# Patient Record
Sex: Female | Born: 1960 | Race: Black or African American | Hispanic: No | Smoking: Never smoker
Health system: Southern US, Community
[De-identification: ages and names within clinical notes are randomized; demographics above are authoritative.]

## PROBLEM LIST (undated history)

## (undated) DIAGNOSIS — Z8669 Personal history of other diseases of the nervous system and sense organs: Secondary | ICD-10-CM

## (undated) DIAGNOSIS — Z8742 Personal history of other diseases of the female genital tract: Secondary | ICD-10-CM

## (undated) DIAGNOSIS — L219 Seborrheic dermatitis, unspecified: Secondary | ICD-10-CM

## (undated) DIAGNOSIS — D219 Benign neoplasm of connective and other soft tissue, unspecified: Secondary | ICD-10-CM

## (undated) DIAGNOSIS — L659 Nonscarring hair loss, unspecified: Secondary | ICD-10-CM

## (undated) DIAGNOSIS — D649 Anemia, unspecified: Secondary | ICD-10-CM

## (undated) DIAGNOSIS — I493 Ventricular premature depolarization: Secondary | ICD-10-CM

## (undated) DIAGNOSIS — A6000 Herpesviral infection of urogenital system, unspecified: Secondary | ICD-10-CM

## (undated) DIAGNOSIS — K219 Gastro-esophageal reflux disease without esophagitis: Secondary | ICD-10-CM

## (undated) DIAGNOSIS — D329 Benign neoplasm of meninges, unspecified: Secondary | ICD-10-CM

## (undated) DIAGNOSIS — J329 Chronic sinusitis, unspecified: Secondary | ICD-10-CM

## (undated) HISTORY — DX: Anemia, unspecified: D64.9

## (undated) HISTORY — DX: Personal history of other diseases of the nervous system and sense organs: Z86.69

## (undated) HISTORY — DX: Gastro-esophageal reflux disease without esophagitis: K21.9

## (undated) HISTORY — DX: Herpesviral infection of urogenital system, unspecified: A60.00

## (undated) HISTORY — PX: TUBAL LIGATION: SHX77

## (undated) HISTORY — DX: Benign neoplasm of meninges, unspecified: D32.9

## (undated) HISTORY — DX: Nonscarring hair loss, unspecified: L65.9

## (undated) HISTORY — DX: Benign neoplasm of connective and other soft tissue, unspecified: D21.9

## (undated) HISTORY — DX: Seborrheic dermatitis, unspecified: L21.9

## (undated) HISTORY — DX: Chronic sinusitis, unspecified: J32.9

## (undated) HISTORY — DX: Personal history of other diseases of the female genital tract: Z87.42

## (undated) HISTORY — DX: Ventricular premature depolarization: I49.3

## (undated) HISTORY — PX: DILATION AND CURETTAGE OF UTERUS: SHX78

## (undated) HISTORY — PX: APPENDECTOMY: SHX54

---

## 1996-12-20 HISTORY — PX: LAPAROSCOPY: SHX197

## 1998-08-11 ENCOUNTER — Encounter: Admission: RE | Admit: 1998-08-11 | Discharge: 1998-08-11 | Payer: Self-pay | Admitting: *Deleted

## 1999-09-09 ENCOUNTER — Encounter: Payer: Self-pay | Admitting: *Deleted

## 1999-09-09 ENCOUNTER — Ambulatory Visit (HOSPITAL_COMMUNITY): Admission: RE | Admit: 1999-09-09 | Discharge: 1999-09-09 | Payer: Self-pay | Admitting: *Deleted

## 2002-04-03 ENCOUNTER — Encounter: Payer: Self-pay | Admitting: Family Medicine

## 2002-04-03 ENCOUNTER — Ambulatory Visit (HOSPITAL_COMMUNITY): Admission: RE | Admit: 2002-04-03 | Discharge: 2002-04-03 | Payer: Self-pay | Admitting: Family Medicine

## 2002-09-13 ENCOUNTER — Ambulatory Visit (HOSPITAL_COMMUNITY): Admission: RE | Admit: 2002-09-13 | Discharge: 2002-09-13 | Payer: Self-pay | Admitting: Gastroenterology

## 2002-10-05 ENCOUNTER — Encounter: Admission: RE | Admit: 2002-10-05 | Discharge: 2002-10-05 | Payer: Self-pay | Admitting: Gastroenterology

## 2002-10-05 ENCOUNTER — Encounter: Payer: Self-pay | Admitting: Gastroenterology

## 2004-01-29 ENCOUNTER — Emergency Department (HOSPITAL_COMMUNITY): Admission: EM | Admit: 2004-01-29 | Discharge: 2004-01-29 | Payer: Self-pay | Admitting: Emergency Medicine

## 2004-04-07 ENCOUNTER — Other Ambulatory Visit: Admission: RE | Admit: 2004-04-07 | Discharge: 2004-04-07 | Payer: Self-pay | Admitting: Family Medicine

## 2005-05-04 ENCOUNTER — Other Ambulatory Visit: Admission: RE | Admit: 2005-05-04 | Discharge: 2005-05-04 | Payer: Self-pay | Admitting: Family Medicine

## 2005-08-06 ENCOUNTER — Encounter (INDEPENDENT_AMBULATORY_CARE_PROVIDER_SITE_OTHER): Payer: Self-pay | Admitting: *Deleted

## 2005-08-06 ENCOUNTER — Ambulatory Visit (HOSPITAL_COMMUNITY): Admission: RE | Admit: 2005-08-06 | Discharge: 2005-08-06 | Payer: Self-pay | Admitting: Obstetrics and Gynecology

## 2006-02-25 ENCOUNTER — Ambulatory Visit: Payer: Self-pay | Admitting: Sports Medicine

## 2006-03-02 ENCOUNTER — Encounter: Admission: RE | Admit: 2006-03-02 | Discharge: 2006-03-17 | Payer: Self-pay | Admitting: Sports Medicine

## 2006-05-17 ENCOUNTER — Ambulatory Visit: Payer: Self-pay | Admitting: Sports Medicine

## 2006-06-05 ENCOUNTER — Encounter: Admission: RE | Admit: 2006-06-05 | Discharge: 2006-06-05 | Payer: Self-pay | Admitting: Sports Medicine

## 2007-03-07 ENCOUNTER — Encounter: Admission: RE | Admit: 2007-03-07 | Discharge: 2007-03-07 | Payer: Self-pay | Admitting: Family Medicine

## 2007-03-17 ENCOUNTER — Encounter: Admission: RE | Admit: 2007-03-17 | Discharge: 2007-03-17 | Payer: Self-pay | Admitting: Family Medicine

## 2007-05-01 ENCOUNTER — Other Ambulatory Visit: Admission: RE | Admit: 2007-05-01 | Discharge: 2007-05-01 | Payer: Self-pay | Admitting: Family Medicine

## 2007-08-22 ENCOUNTER — Encounter: Admission: RE | Admit: 2007-08-22 | Discharge: 2007-08-22 | Payer: Self-pay | Admitting: Family Medicine

## 2007-11-12 ENCOUNTER — Emergency Department (HOSPITAL_COMMUNITY): Admission: EM | Admit: 2007-11-12 | Discharge: 2007-11-12 | Payer: Self-pay | Admitting: Emergency Medicine

## 2008-01-29 ENCOUNTER — Ambulatory Visit (HOSPITAL_COMMUNITY): Admission: RE | Admit: 2008-01-29 | Discharge: 2008-01-29 | Payer: Self-pay | Admitting: Family Medicine

## 2008-03-07 ENCOUNTER — Encounter: Admission: RE | Admit: 2008-03-07 | Discharge: 2008-03-07 | Payer: Self-pay | Admitting: Family Medicine

## 2008-08-30 ENCOUNTER — Other Ambulatory Visit: Admission: RE | Admit: 2008-08-30 | Discharge: 2008-08-30 | Payer: Self-pay | Admitting: Family Medicine

## 2008-09-19 HISTORY — PX: NOVASURE ABLATION: SHX5394

## 2009-02-17 HISTORY — PX: VAGINAL HYSTERECTOMY: SHX2639

## 2009-02-25 ENCOUNTER — Inpatient Hospital Stay (HOSPITAL_COMMUNITY): Admission: RE | Admit: 2009-02-25 | Discharge: 2009-02-26 | Payer: Self-pay | Admitting: Obstetrics and Gynecology

## 2009-02-25 ENCOUNTER — Encounter (INDEPENDENT_AMBULATORY_CARE_PROVIDER_SITE_OTHER): Payer: Self-pay | Admitting: Obstetrics and Gynecology

## 2009-03-10 ENCOUNTER — Encounter: Admission: RE | Admit: 2009-03-10 | Discharge: 2009-03-10 | Payer: Self-pay | Admitting: Family Medicine

## 2009-03-13 ENCOUNTER — Encounter: Admission: RE | Admit: 2009-03-13 | Discharge: 2009-03-13 | Payer: Self-pay | Admitting: Obstetrics and Gynecology

## 2009-08-22 ENCOUNTER — Emergency Department (HOSPITAL_COMMUNITY): Admission: EM | Admit: 2009-08-22 | Discharge: 2009-08-22 | Payer: Self-pay | Admitting: Emergency Medicine

## 2009-08-27 ENCOUNTER — Emergency Department (HOSPITAL_COMMUNITY): Admission: EM | Admit: 2009-08-27 | Discharge: 2009-08-27 | Payer: Self-pay | Admitting: Emergency Medicine

## 2010-03-11 ENCOUNTER — Encounter: Admission: RE | Admit: 2010-03-11 | Discharge: 2010-03-11 | Payer: Self-pay | Admitting: Family Medicine

## 2010-03-23 ENCOUNTER — Encounter: Admission: RE | Admit: 2010-03-23 | Discharge: 2010-03-23 | Payer: Self-pay | Admitting: Family Medicine

## 2010-03-26 ENCOUNTER — Encounter: Admission: RE | Admit: 2010-03-26 | Discharge: 2010-03-26 | Payer: Self-pay | Admitting: Family Medicine

## 2011-02-22 ENCOUNTER — Other Ambulatory Visit: Payer: Self-pay | Admitting: Family Medicine

## 2011-02-22 DIAGNOSIS — Z1231 Encounter for screening mammogram for malignant neoplasm of breast: Secondary | ICD-10-CM

## 2011-03-15 ENCOUNTER — Ambulatory Visit
Admission: RE | Admit: 2011-03-15 | Discharge: 2011-03-15 | Disposition: A | Discharge status: Home / Self Care | Payer: BC Managed Care – PPO | Source: Ambulatory Visit | Attending: Family Medicine | Admitting: Family Medicine

## 2011-03-15 DIAGNOSIS — Z1231 Encounter for screening mammogram for malignant neoplasm of breast: Secondary | ICD-10-CM

## 2011-03-16 ENCOUNTER — Other Ambulatory Visit: Payer: Self-pay | Admitting: Family Medicine

## 2011-03-16 DIAGNOSIS — R928 Other abnormal and inconclusive findings on diagnostic imaging of breast: Secondary | ICD-10-CM

## 2011-03-24 ENCOUNTER — Ambulatory Visit
Admission: RE | Admit: 2011-03-24 | Discharge: 2011-03-24 | Disposition: A | Discharge status: Home / Self Care | Payer: BC Managed Care – PPO | Source: Ambulatory Visit | Attending: Family Medicine | Admitting: Family Medicine

## 2011-03-24 DIAGNOSIS — R928 Other abnormal and inconclusive findings on diagnostic imaging of breast: Secondary | ICD-10-CM

## 2011-04-01 LAB — BASIC METABOLIC PANEL
Calcium: 8.9 mg/dL (ref 8.4–10.5)
Chloride: 103 mEq/L (ref 96–112)
Creatinine, Ser: 0.9 mg/dL (ref 0.4–1.2)
GFR calc Af Amer: >60 mL/min (ref >60)
Glucose, Bld: 78 mg/dL (ref 70–99)

## 2011-04-01 LAB — CBC
Hemoglobin: 10.3 g/dL — ABNORMAL LOW (ref 12.0–15.0)
MCHC: 33.2 g/dL (ref 30.0–36.0)
Platelets: 280 10*3/uL (ref 150–400)
RBC: 3.37 MIL/uL — ABNORMAL LOW (ref 3.87–5.11)
RBC: 4.06 MIL/uL (ref 3.87–5.11)
RDW: 14.7 % (ref 11.5–15.5)
WBC: 5.4 10*3/uL (ref 4.0–10.5)
WBC: 6.9 10*3/uL (ref 4.0–10.5)

## 2011-04-01 LAB — TYPE AND SCREEN: Antibody Screen: NEGATIVE

## 2011-04-01 LAB — ABO/RH: ABO/RH(D): O POS

## 2011-05-04 NOTE — Op Note (Signed)
NAME:  Haley Powell, Haley Powell NO.:  [id number]   MEDICAL RECORD NO.:  [id number]          PATIENT TYPE:  INP   LOCATION:  9320                          FACILITY:  [facility]   PHYSICIAN:  Charles A. Delcambre, MDDATE OF BIRTH:  [birth year]   DATE OF PROCEDURE:  02/25/2009  DATE OF DISCHARGE:                               OPERATIVE REPORT   PREOPERATIVE DIAGNOSES:  1. Irregular menses.  2. Menorrhagia.  3. Failed NovaSure.   PROCEDURES:  1. Laparoscopic-assisted vaginal hysterectomy.  2. Left salpingo-oophorectomy.   SURGEON:  Charles A. Sydnee Cabal, MD.   ASSISTANT:  Gerald Leitz, MD   COMPLICATIONS:  None.   ESTIMATED BLOOD LOSS:  100 mL.   URINE OUTPUT:  400 mL.   IV FLUIDS:  2 L of lactated Ringer's.   SPECIMEN:  Left ovary and tubes.  Uterus and cervix to the Pathology.   FINDINGS:  Left ovary had a normal appearance.  Multiple cyst with  several small paratubal cyst, but the main ovary body, 3-4 cm cyst not  appearing to be a simple cyst.  No other suspicious findings.  See  dictation of the procedure.   ANESTHESIA:  General by the endotracheal route.   DESCRIPTION OF PROCEDURE:  The patient was taken to the operating room,  placed in supine position and general anesthetic was induced without  difficulty.  She was sterilely prepped in dorsal lithotomy position.  Two tenaculum was placed on the cervix for uterine manipulation during  the case.  Attention was turned the abdomen, 0.25% percent plain  Marcaine was injected, a total of 10 mL between two side ports and the  umbilical port.  A 1-cm incision was made at the umbilicus.  Veress  needle was placed.  Aspiration injection, re-aspiration, hanging drop  test as well as insufflation pressure is less than 8 mmHg at 2 L  insufflation.  All indicated intraperitoneal location of the needle.  A  2.5 L of CO2 was insufflated with good pneumoperitoneum established.  Veress needle was removed with anterior traction at  the abdominal wall.  A 10-mm port was placed.  Laparoscope was placed immediately verifying  no injury to surrounding structures.  Under direct visualization, stab  incisions were made down approximately 5 cm and lateral 5 cm on either  side to assist with LAVH.  Needle was placed.  The site was isolated  away from the vascular tissue and stab incisions were made in these  areas, 5-mm ports were placed under direct visualization.  There was no  damage to bowel, bladder, or vascular structures.  The patient was  placed in Trendelenburg.  Findings are noted above.  The ureter was  isolated on the left well.  Evaluation of the ovary and  infundibulopelvic pedicle, judgment was made to remove the left ovary  and leave the right ovary, which appeared normal.  She had been  consented in this regard verbally.  The infundibulopelvic pedicle was  crossed and the round ligament successfully taken down on the left side,  hugging up against ovary and the uterus with the EnSeal device and  nicely done.  Bladder was taken down.  There were some bladder  adhesions.  These were taken down with the EnSeal and with sharp  Metzenbaum incision and dissection.  The right utero-ovarian pedicle was  taken down including round ligament and remaining cardinal ligaments on  the right.  Hemostasis was good on all ports and places and attention  was turned to the vagina approach.  A weighted speculum was placed.  Lahey clamps were placed on the cervix.  Injection of 1% lidocaine with  1:200 epinephrine was injected circumferentially, submucosally around  the cervix, total of 10 mL.  Knife was used to score around the cervix  and Mayo scissors were used to start bladder plane.  Bladder pillars  were cut and once good plane was established, Ray-Tec was placed and  pushed up into this space, adequately and carefully dissecting the  bladder, the remainder of the way to the prior opening from the  laparoscopic approach.   The retractor was placed into this space,  posterior colpotomy was done without difficulty.  A Bonnano weighted  retractor was placed.  Uterosacral ligaments were taken, transfixion  stitched and held.  Uterine vessels were taken on either side,  transfixion stitched and held.  Hemostasis was good.  Second and third  layers level bites were taken on either side and this reached the  cornual region.  One area of bleeding between 2 pedicles on the  patient's right were closed with 2 figure-of-eight sutures.  Hemostasis  was excellent.  These were placed superficially.  Specimen was removed  through vagina.  Vaginal cuff was of adequate hemostasis.  Richardson  angle sutures were placed on either side.  A 0 Vicryl was then used to  close the cuff in a running locking horizontal fashion.  Hemostasis was  excellent.  Visualization was taken to the top again and  laparoscopically could see the operative field and it was entirely  hemostatic.  Irrigation was carried out and hemostasis was verified.  Trocars removed under direct visualization.  No significant bleeding was  noted.  A 0 Vicryl was placed on the fascial stitch at the umbilicus.  A  4-0 Monocryl was then used to close the skin.  Dermabond was used to  close the lower port sites.  Bovie was used on the left secondary to  minor subcutaneous bleeding.  Hemostasis was excellent and the Dermabond  was placed.  The patient was awakened and taken to recovery with  physician in attendance, having tolerated procedure well.      Charles A. Sydnee Cabal, MD  Electronically Signed     CAD/MEDQ  D:  02/25/2009  T:  02/25/2009  Job:  [id number]

## 2011-05-04 NOTE — H&P (Signed)
NAME:  Haley Powell, Haley Powell NO.:  [id number]   MEDICAL RECORD NO.:  [id number]          PATIENT TYPE:  AMB   LOCATION:  SDC                           FACILITY:  [facility]   PHYSICIAN:  Charles A. Delcambre, MDDATE OF BIRTH:  [birth year]   DATE OF ADMISSION:  DATE OF DISCHARGE:                              HISTORY & PHYSICAL   This patient is to be admitted to undergo laparoscopic-assisted vaginal  hysterectomy.  She wants to preserve the ovaries unless something  abnormal seen.  She has had a diagnostic laparoscopy in the past that  showed adhesive disease, therefore, we will proceed with laparoscopic-  assisted vaginal hysterectomy noted.  She also had a C-section.  She did  have a vaginal delivery as well.  However, uterus does descent.   PAST MEDICAL HISTORY:  HSC.   SURGERIES:  Appendectomy, C-section, diagnostic laparoscopy, and office  NovaSure failed.   MEDICATIONS:  Valtrex 500 mg once a day.   ALLERGIES:  No known drug allergies.   SOCIAL HISTORY:  No tobacco and drug abuse.  She is single.   FAMILY HISTORY:  Father deceased colon cancer.  Mother heart disease.  Otherwise, no major illnesses.   REVIEW OF SYSTEMS:  No chest pain, shortness of breath, diarrhea,  constipation, headaches, nausea, vision changes, urinary incontinence,  or bleeding per rectum.   PHYSICAL EXAMINATION:  GENERAL:  Alert and oriented x3.  VITAL SIGNS:  Blood pressure 118/70, heart rate 88, respirations 20,  afebrile.  LUNGS:  Clear bilaterally.  HEART:  Regular rate and rhythm.  ABDOMEN: Soft, flat, nontender.  No masses palpable.  PELVIC:  Normal external female genitalia.  Bartholin, urethra, Skene's  within normal limit.  Vault without discharge or lesions.  Multiparous  cervix is noted.  Uterus not enlarged.  Retroflexed descends with  Valsalva 2 within 2 cm of the introitus and with tenaculum to the  introitus.  Ovaries are palpable, normal size bilaterally.   ASSESSMENT:   Continued irregular and heavy bleeding, despite NovaSure  ablation.  She wishes to undergo definitive therapy.   PLAN:  Laparoscopic-assisted vaginal hysterectomy secondary to past  history of adhesive disease seen on laparoscopy.  She wishes to preserve  ovaries and at age 73 understands risks involved.  She is 50 year old  para 2, currently not sexually active.  We will obtain a CBC and BMET  preoperatively type and screen on cefotetan 2 g IV will be given on call  to the OR.  She will  remain n.p.o. past midnight evening prior to  surgery.  She accepts risks of infection, bleeding, bowel and bladder  damage, blood product risk including hepatitis, and HIV exposure.  Questions were answered.  She gives informed consent, remote chance of  left vertically abdominal approach is discussed as well as injury and  increased injury of the bladder with this previous cesarean section.  We will proceed as outlined.      Charles A. Sydnee Cabal, MD  Electronically Signed     CAD/MEDQ  D:  02/11/2009  T:  02/11/2009  Job:  [id number]

## 2011-05-07 NOTE — Op Note (Signed)
NAME:  Haley Powell, Haley Powell NO.:  [id number]   MEDICAL RECORD NO.:  [id number]          PATIENT TYPE:  AMB   LOCATION:  SDC                           [facility]ILITY:  [facility]   PHYSICIAN:  Charles A. Delcambre, MDDATE OF BIRTH:  [birth year]   DATE OF PROCEDURE:  08/06/2005  DATE OF DISCHARGE:                                 OPERATIVE REPORT   PREOPERATIVE DIAGNOSES:  1.  Pelvic pain.  2.  Ovarian cysts on CT.  3.  Small fibroids on ultrasound.  4.  Probable endometrial polyps.  5.  Abnormal uterine bleeding.   POSTOPERATIVE DIAGNOSES:  1.  Pelvic pain.  2.  Pelvic adhesive disease.  3.  Endometrial polyps.   PROCEDURE:  1.  Paracervical block.  2.  Diagnostic laparoscopy.  3.  Lysis of adhesions.  4.  Hysteroscopy.  5.  Dilation and curettage.  6.  Polypectomy.   SURGEON:  Dr. Sydnee Cabal   ASSISTANT:  None.   COMPLICATIONS:  None.   ESTIMATED BLOOD LOSS:  Less than 10 mL.   SPECIMENS:  Endometrial polyps and endometrial curettings to pathology.   ANESTHESIA:  General by the endotracheal route.   OPERATIVE FINDINGS:  Pelvic adhesive disease densely adherent to the right  anterior abdominal wall from previous appendectomy.  No pelvic endometriosis  was noted.  Ovaries appeared normal.  Clips were present from previous tubal  ligation.  Cul-de-sac appeared normal.  Ovaries flipped up normally.  Ovarian fossa were clear bilaterally.  No evidence of ovarian cyst was  noted.  Instrument, sponge, and needle count correct x 2.   DESCRIPTION OF PROCEDURE:  The patient was taken to the operating room and  placed in the supine position.  Anesthesia was dosed without difficulty  general by the endotracheal route.  She was then placed in dorsal lithotomy  position, and sterile prep and drape was undertaken.  Colon cannula was  placed on the cervix with single-tooth tenaculum, but attention was given to  the abdominal procedure.  Veress needle was placed through a 1 cm  incision  at the umbilicus and with downward traction on the abdominal wall.  Aspiration and hanging-drop test and insufflation pressure less than 5 mmHg  at 2 L of CO2 insufflation per minute and all integrated into peritoneal  location.  Adequate pneumoperitoneum was obtained at 2.5 L insufflation, and  Veress needle was removed.  With anterior traction on the abdominal wall  once again, an 11 mm port was placed, and immediate verification was noted  of intraperitoneal location without damage to bowel, bladder, or vascular  structures.  Of note, before umbilical and suprapubic ports were placed,  0.25% Marcaine was placed subcutaneously.  Pelvic exam was undertaken with  findings as noted above.  Under direct visualization, a 5 mm port was placed  suprapubically.  Pelvic findings were noted with no damage to bowel,  bladder, or vascular structures with this port.  With bipolar cautery, the  omental adhesions were taken down sharply without damage to surrounding  structures.  Hemostasis was excellent after sharp dissection.  Desufflation  was allowed to occur.  Peritoneal edge was visualized upon removal of the  umbilical port.  All instruments were accounted for, and 0 Vicryl was used  to close the fascial layer at the umbilical port incision under direct  visualization.  Subcuticular suture of 3-0 Monocryl was used x 2 to close  the umbilical incision.  Sterile pressure dressing was placed.  Suprapubic 5  mm port site was closed with figure-of-eight 3-0 Monocryl with good  hemostasis resulting.  Attention was then turned to hysteroscopy.  Cervix  was opened somewhat, almost to admit Hank's dilator, 20, and this allowed  direct passage of the 5 mm scope.  Multiple endometrial polyps were noted.  Using polyp forceps, polyps were removed separately, and scope and  generalized curettage was undertaken with small banjo curette.  There was no  evidence of perforation.  Sorbitol was used for  uterine distention medium,  and 40 mL were noted lost during the procedure.  Paracervical block of 0.25%  plain Marcaine was injected at 4 and 8 o'clock, divided 10 mL between the 2  sites, and there was no evidence of intravascular injection before  hysteroscopy.  Having completed the [facility] polypectomy, all instruments were  removed.  The patient tolerated the procedure well and was taken to recovery  with physician in attendance.      Charles A. Sydnee Cabal, MD  Electronically Signed     CAD/MEDQ  D:  08/06/2005  T:  08/06/2005  Job:  [id number]

## 2011-05-07 NOTE — Op Note (Signed)
Haley Powell, Haley Powell                            ACCOUNT NO.:  [id number]   MEDICAL RECORD NO.:  [id number]                   PATIENT TYPE:  AMB   LOCATION:  ENDO                                 FACILITY:  [facility]   PHYSICIAN:  Charna Elizabeth, M.D.                   DATE OF BIRTH:  1961-04-29   DATE OF PROCEDURE:  09/13/2002  DATE OF DISCHARGE:                                 OPERATIVE REPORT   PROCEDURE PERFORMED:  Screening colonoscopy.   ENDOSCOPIST:  Charna Elizabeth, M.D.   INSTRUMENT USED:  Olympus video colonoscope (adjustable pediatric scope).   INDICATIONS FOR PROCEDURE:  Rectal bleeding in a 50 year old African-  American female who has family history of colon cancer in her father, who  was diagnosed at 57 and died at 50.  Rule out colonic polyps, masses,  hemorrhoids, etc.   PREPROCEDURE PREPARATION:  Informed consent was procured from the patient.  The patient was fasted for eight hours prior to the procedure and prepped  with a bottle of magnesium citrate and a gallon of NuLytely the night prior  to the procedure.   PREPROCEDURE PHYSICAL:  The patient had stable vital signs. Neck supple.  Chest clear to auscultation.  S1 and S2 regular.  Abdomen soft with normal  bowel sounds.   DESCRIPTION OF PROCEDURE:  The patient was placed in left lateral decubitus  position and sedated with an additional 10 mg of Demerol and 1 mg of Versed  intravenously.  Once the patient was adequately sedated and maintained on  low flow oxygen and continuous cardiac monitoring, the Olympus video  colonoscope was advanced from the rectum to the cecum and terminal ileum  without difficulty.  Except for small internal hemorrhoids and a small  external hemorrhoid, no other abnormalities were seen.  The entire colonic  mucosa appeared healthy with a normal vascular pattern, no masses, polyps,  erosions, ulcerations or diverticula were present.   IMPRESSION:  1. Normal colonoscopy up to the terminal  ileum except for small nonbleeding     internal and external hemorrhoids.  2. No masses or polyps found at present.   RECOMMENDATIONS:  1. A high fiber diet has been recommended for the patient along with liberal     fluid intact.  2.     Repeat colorectal cancer screening will be done in the next five years     unless the patient develops any abnormal symptoms in the interim.  3. Outpatient follow-up in the next two weeks for further recommendation.                                                  Charna Elizabeth, M.D.    JM/MEDQ  D:  09/13/2002  T:  09/13/2002  Job:  [id number]   cc:   Stacie [place]. Cliffton Asters, M.D.

## 2011-05-07 NOTE — H&P (Signed)
NAME:  Haley Powell), Haley Powell NO.:  [id number]   MEDICAL RECORD NO.:  [id number]           PATIENT TYPE:   LOCATION:                                 FACILITY:   PHYSICIAN:  Charles A. Delcambre, MD    DATE OF BIRTH:   DATE OF ADMISSION:  10/19/2007  DATE OF DISCHARGE:                              HISTORY & PHYSICAL   This patient will be admitted on October 19, 2007 to undergo  ThermaChoice endometrial ablation and hysteroscopy. She gives informed  consent, accepts risk of failed procedure technically, uterine  perforation, bowel and bladder damage, blood product risk including HIV  and hepatitis exposure. We expect a 30-40% amenorrheic rate but are  mainly desiring with the main goal of 80-90% eumenorrhea at least, 10%  approximate failure rate outright to control her bleeding. She wishes to  proceed with this and avoid Depo-Provera or birth control pills or  Provera, any hormonal manipulation.   PAST MEDICAL HISTORY:  None.   PAST SURGICAL HISTORY:  Laparoscopy, lysis of adhesions, hysteroscopy,  dilatation and curettage, lumpectomy benign.   MEDICATIONS:  Aleve p.r.n.   ALLERGIES:  No known drug allergies.   FAMILY HISTORY:  Father 33 deceased of colon cancer. Mother with heart  disease. Otherwise no major illnesses.   REVIEW OF SYSTEMS:  No chest pain, shortness of breath, wheezing,  headaches, nausea, vomiting, diarrhea, constipation, incontinence or  bleeding per rectum.   PHYSICAL EXAMINATION:  Alert and oriented x3 in no distress. Blood  pressure 110/70, respirations 16, heart rate 76, afebrile.  HEENT:  Grossly within normal limits.  CORONARY:  Regular rate and rhythm without murmur, rub or gallop.  LUNGS:  Are clear bilaterally.  ABDOMEN:  Soft, flat and nontender with no hepatosplenomegaly or masses  noted.  PELVIC:  Normal female genitalia. Bartholin's and urethral scheme within  normal limits. Vault without discharge or lesions. Uterus not  enlarged.  Sound was to 7 cm with endometrial biopsy done today.   ASSESSMENT:  Menorrhagia with hygienic accidents with periods every 28  days lasting 8 days, 5 of which are heavy, 2 of which are hygienically  compromising.   PLAN:  Hysteroscopy for history of polyps and then ThermaChoice  endometrial ablation. She gives informed consent and follow up as  directed. __________.      Charles A. Sydnee Cabal, MD  Electronically Signed     CAD/MEDQ  D:  10/02/2007  T:  10/03/2007  Job:  [id number]

## 2011-05-07 NOTE — Op Note (Signed)
   Haley Powell, Haley Powell                            ACCOUNT NO.:  [id number]   MEDICAL RECORD NO.:  [id number]                   PATIENT TYPE:  AMB   LOCATION:  ENDO                                 FACILITY:  [facility]   PHYSICIAN:  Charna Elizabeth, M.D.                   DATE OF BIRTH:  30-Dec-1960   DATE OF PROCEDURE:  09/13/2002  DATE OF DISCHARGE:                                 OPERATIVE REPORT   PROCEDURE PERFORMED:  Esophagogastroduodenoscopy.   ENDOSCOPIST:  Charna Elizabeth, M.D.   INSTRUMENT USED:  Olympus video panendoscope.   INDICATIONS FOR PROCEDURE:  The patient is a 50 year old white female with a  history of abdominal pain on Nexium, rule out peptic ulcer disease,  esophagitis, gastritis, etc.   PREPROCEDURE PREPARATION:  Informed consent was procured from the patient.  The patient was fasted for eight hours prior to the procedure.   PREPROCEDURE PHYSICAL:  The patient had stable vital signs.  Neck supple,  chest clear to auscultation.  S1, S2 regular.  Abdomen soft with normal  bowel sounds.   DESCRIPTION OF PROCEDURE:  The patient was placed in the left lateral  decubitus position and sedated with 60 mg of Demerol and 6 mg of Versed  intravenously.  Once the patient was adequately sedated and maintained on  low-flow oxygen and continuous cardiac monitoring, the Olympus video  panendoscope was advanced through the mouth piece over the tongue into the  esophagus under direct vision.  The entire esophagus appeared normal with no  evidence of ring, stricture, masses, esophagitis or Barrett's mucosa.  The  scope was then advanced to the stomach.  A small  hiatal hernia was seen on  high retroflexion.  The rest of the gastric mucosa and proximal small bowel  appeared normal.   IMPRESSION:  Normal esophagogastroduodenoscopy except for a small  hiatal  hernia.   RECOMMENDATIONS:  1. Continue Nexium for now.  2. An abdominal ultrasound with a HIDA scan will be done with attention  to     the gallbladder, rule out gallbladder pathology.  3. Proceed with a colonoscopy at this time.                                                Charna Elizabeth, M.D.    JM/MEDQ  D:  09/13/2002  T:  09/13/2002  Job:  [id number]   cc:   Stacie [place]. Cliffton Asters, M.D.
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
## 2011-11-18 ENCOUNTER — Other Ambulatory Visit: Payer: Self-pay | Admitting: Gastroenterology

## 2011-12-31 ENCOUNTER — Other Ambulatory Visit: Payer: BC Managed Care – PPO

## 2012-01-10 ENCOUNTER — Ambulatory Visit
Admission: RE | Admit: 2012-01-10 | Discharge: 2012-01-10 | Disposition: A | Discharge status: Home / Self Care | Payer: BC Managed Care – PPO | Source: Ambulatory Visit | Attending: Gastroenterology | Admitting: Gastroenterology

## 2012-02-24 ENCOUNTER — Other Ambulatory Visit: Payer: Self-pay | Admitting: Family Medicine

## 2012-02-24 DIAGNOSIS — Z1231 Encounter for screening mammogram for malignant neoplasm of breast: Secondary | ICD-10-CM

## 2012-03-24 ENCOUNTER — Ambulatory Visit
Admission: RE | Admit: 2012-03-24 | Discharge: 2012-03-24 | Disposition: A | Discharge status: Home / Self Care | Payer: BC Managed Care – PPO | Source: Ambulatory Visit | Attending: Family Medicine | Admitting: Family Medicine

## 2012-03-24 DIAGNOSIS — Z1231 Encounter for screening mammogram for malignant neoplasm of breast: Secondary | ICD-10-CM

## 2012-06-07 ENCOUNTER — Telehealth: Payer: Self-pay

## 2012-06-07 NOTE — Telephone Encounter (Signed)
mobic denied please explain

## 2012-08-23 ENCOUNTER — Other Ambulatory Visit: Payer: Self-pay | Admitting: Family Medicine

## 2012-08-23 ENCOUNTER — Ambulatory Visit
Admission: RE | Admit: 2012-08-23 | Discharge: 2012-08-23 | Disposition: A | Discharge status: Home / Self Care | Payer: BC Managed Care – PPO | Source: Ambulatory Visit | Attending: Family Medicine | Admitting: Family Medicine

## 2012-08-23 DIAGNOSIS — M79609 Pain in unspecified limb: Secondary | ICD-10-CM

## 2013-02-20 ENCOUNTER — Other Ambulatory Visit: Payer: Self-pay

## 2013-03-26 ENCOUNTER — Ambulatory Visit
Admission: RE | Admit: 2013-03-26 | Discharge: 2013-03-26 | Disposition: A | Discharge status: Home / Self Care | Payer: BC Managed Care – PPO | Source: Ambulatory Visit

## 2013-03-26 DIAGNOSIS — Z1231 Encounter for screening mammogram for malignant neoplasm of breast: Secondary | ICD-10-CM

## 2014-06-18 ENCOUNTER — Other Ambulatory Visit: Payer: Self-pay

## 2014-06-18 DIAGNOSIS — Z1231 Encounter for screening mammogram for malignant neoplasm of breast: Secondary | ICD-10-CM

## 2014-06-28 ENCOUNTER — Ambulatory Visit
Admission: RE | Admit: 2014-06-28 | Discharge: 2014-06-28 | Disposition: A | Discharge status: Home / Self Care | Payer: BC Managed Care – PPO | Source: Ambulatory Visit

## 2014-06-28 ENCOUNTER — Encounter (INDEPENDENT_AMBULATORY_CARE_PROVIDER_SITE_OTHER): Payer: Self-pay

## 2014-06-28 DIAGNOSIS — Z1231 Encounter for screening mammogram for malignant neoplasm of breast: Secondary | ICD-10-CM

## 2014-10-02 ENCOUNTER — Encounter: Payer: Self-pay | Admitting: Neurology

## 2014-10-02 ENCOUNTER — Ambulatory Visit (INDEPENDENT_AMBULATORY_CARE_PROVIDER_SITE_OTHER): Payer: BC Managed Care – PPO | Admitting: Neurology

## 2014-10-02 ENCOUNTER — Encounter (INDEPENDENT_AMBULATORY_CARE_PROVIDER_SITE_OTHER): Payer: Self-pay

## 2014-10-02 VITALS — BP 138/86 | HR 60 | Ht 72.0 in | Wt 154.0 lb

## 2014-10-02 DIAGNOSIS — R51 Headache: Secondary | ICD-10-CM

## 2014-10-02 DIAGNOSIS — Z9109 Other allergy status, other than to drugs and biological substances: Secondary | ICD-10-CM

## 2014-10-02 DIAGNOSIS — D329 Benign neoplasm of meninges, unspecified: Secondary | ICD-10-CM | POA: Insufficient documentation

## 2014-10-02 DIAGNOSIS — Z91048 Other nonmedicinal substance allergy status: Secondary | ICD-10-CM

## 2014-10-02 DIAGNOSIS — R519 Headache, unspecified: Secondary | ICD-10-CM | POA: Insufficient documentation

## 2014-10-02 MED ORDER — CETIRIZINE HCL 10 MG PO CAPS: 1.0000 | ORAL_CAPSULE | Freq: Every day | ORAL | Status: DC
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
## 2014-10-02 NOTE — Patient Instructions (Signed)
I had a long discussion with the patient regarding her sinus and periorbital headaches as well as occipital headaches. I feel she may have component of allergies with muscle tension headache. Recommend a trial of Zyrtec 10 mg daily for a month to see if that helps. I have also advised her to do daily neck stretching exercises. Check MRI scan of the brain to followup on her prior known history of small meningioma. Return for followup in 2 months for call earlier if necessary.  Sinus Headache A sinus headache is when your sinuses become clogged or swollen. Sinus headaches can range from mild to severe.  CAUSES A sinus headache can have different causes, such as:  Colds.  Sinus infections.  Allergies. SYMPTOMS  Symptoms of a sinus headache may vary and can include:  Headache.  Pain or pressure in the face.  Congested or runny nose.  Fever.  Inability to smell.  Pain in upper teeth. Weather changes can make symptoms worse. TREATMENT  The treatment of a sinus headache depends on the cause.  Sinus pain caused by a sinus infection may be treated with antibiotic medicine.  Sinus pain caused by allergies may be helped by allergy medicines (antihistamines) and medicated nasal sprays.  Sinus pain caused by congestion may be helped by flushing the nose and sinuses with saline solution. HOME CARE INSTRUCTIONS   If antibiotics are prescribed, take them as directed. Finish them even if you start to feel better.  Only take over-the-counter or prescription medicines for pain, discomfort, or fever as directed by your caregiver.  If you have congestion, use a nasal spray to help reduce pressure. SEEK IMMEDIATE MEDICAL CARE IF:  You have a fever.  You have headaches more than once a week.  You have sensitivity to light or sound.  You have repeated nausea and vomiting.  You have vision problems.  You have sudden, severe pain in your face or head.  You have a seizure.  You are  confused.  Your sinus headaches do not get better after treatment. Many people think they have a sinus headache when they actually have migraines or tension headaches. MAKE SURE YOU:   Understand these instructions.  Will watch your condition.  Will get help right away if you are not doing well or get worse. Document Released: 01/13/2005 Document Revised: 02/28/2012 Document Reviewed: 03/06/2011 [facility] Patient Information 2015 [place], [place]. This information is not intended to replace advice given to you by your health care provider. Make sure you discuss any questions you have with your health care provider.

## 2014-10-16 ENCOUNTER — Ambulatory Visit (INDEPENDENT_AMBULATORY_CARE_PROVIDER_SITE_OTHER): Payer: BC Managed Care – PPO

## 2014-10-16 DIAGNOSIS — D329 Benign neoplasm of meninges, unspecified: Secondary | ICD-10-CM

## 2014-10-17 MED ORDER — GADOPENTETATE DIMEGLUMINE 469.01 MG/ML IV SOLN: 14.0000 mL | Freq: Once | INTRAVENOUS | Status: AC | PRN

## 2015-01-06 ENCOUNTER — Encounter: Payer: Self-pay | Admitting: Neurology

## 2015-01-06 ENCOUNTER — Ambulatory Visit (INDEPENDENT_AMBULATORY_CARE_PROVIDER_SITE_OTHER): Payer: BC Managed Care – PPO | Admitting: Neurology

## 2015-01-06 VITALS — BP 117/78 | HR 62 | Ht 72.0 in | Wt 159.6 lb

## 2015-01-06 DIAGNOSIS — H5712 Ocular pain, left eye: Secondary | ICD-10-CM
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
## 2015-01-06 NOTE — Patient Instructions (Signed)
I had a long discussion with the patient with regards to her headaches which seem to have improved and personally reviewed and discussed findings of her MRI scan of the brain which appear to be stable and likely represent dural calcifications versus small calcified meningiomas. I also discussed with her occasional episodes of sharp left eye pain that she's had are  unlikely to be anything significant. She was advised to watch this and if this gets worse to call for an appointment. No routine follow up appointment was made.

## 2015-02-24 ENCOUNTER — Emergency Department (HOSPITAL_COMMUNITY): Payer: BC Managed Care – PPO

## 2015-02-24 ENCOUNTER — Emergency Department (HOSPITAL_COMMUNITY)
Admission: EM | Admit: 2015-02-24 | Discharge: 2015-02-24 | Disposition: A | Discharge status: Home / Self Care | Payer: BC Managed Care – PPO | Attending: Emergency Medicine | Admitting: Emergency Medicine

## 2015-02-24 ENCOUNTER — Encounter (HOSPITAL_COMMUNITY): Payer: Self-pay | Admitting: Emergency Medicine

## 2015-02-24 DIAGNOSIS — K219 Gastro-esophageal reflux disease without esophagitis: Secondary | ICD-10-CM | POA: Insufficient documentation

## 2015-02-24 DIAGNOSIS — Z8709 Personal history of other diseases of the respiratory system: Secondary | ICD-10-CM | POA: Diagnosis not present

## 2015-02-24 DIAGNOSIS — Z8742 Personal history of other diseases of the female genital tract: Secondary | ICD-10-CM | POA: Diagnosis not present

## 2015-02-24 DIAGNOSIS — Z8619 Personal history of other infectious and parasitic diseases: Secondary | ICD-10-CM | POA: Insufficient documentation

## 2015-02-24 DIAGNOSIS — S0990XA Unspecified injury of head, initial encounter: Secondary | ICD-10-CM | POA: Insufficient documentation

## 2015-02-24 DIAGNOSIS — Z86011 Personal history of benign neoplasm of the brain: Secondary | ICD-10-CM | POA: Insufficient documentation

## 2015-02-24 DIAGNOSIS — D649 Anemia, unspecified: Secondary | ICD-10-CM | POA: Diagnosis not present

## 2015-02-24 DIAGNOSIS — Y92410 Unspecified street and highway as the place of occurrence of the external cause: Secondary | ICD-10-CM | POA: Insufficient documentation

## 2015-02-24 DIAGNOSIS — Z872 Personal history of diseases of the skin and subcutaneous tissue: Secondary | ICD-10-CM | POA: Diagnosis not present

## 2015-02-24 DIAGNOSIS — Z79899 Other long term (current) drug therapy: Secondary | ICD-10-CM | POA: Insufficient documentation

## 2015-02-24 DIAGNOSIS — Y998 Other external cause status: Secondary | ICD-10-CM | POA: Diagnosis not present

## 2015-02-24 DIAGNOSIS — Y9389 Activity, other specified: Secondary | ICD-10-CM | POA: Insufficient documentation

## 2015-02-24 DIAGNOSIS — S199XXA Unspecified injury of neck, initial encounter: Secondary | ICD-10-CM | POA: Diagnosis present

## 2015-02-24 DIAGNOSIS — S134XXA Sprain of ligaments of cervical spine, initial encounter: Secondary | ICD-10-CM | POA: Insufficient documentation

## 2015-02-24 DIAGNOSIS — Z8679 Personal history of other diseases of the circulatory system: Secondary | ICD-10-CM | POA: Diagnosis not present

## 2015-02-24 MED ORDER — METHOCARBAMOL 500 MG PO TABS: 500.0000 mg | ORAL_TABLET | Freq: Two times a day (BID) | ORAL | Status: AC

## 2015-02-24 MED ORDER — IBUPROFEN 800 MG PO TABS: 800.0000 mg | ORAL_TABLET | Freq: Three times a day (TID) | ORAL | Status: AC
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
## 2015-04-07 ENCOUNTER — Other Ambulatory Visit: Payer: Self-pay | Admitting: Surgery

## 2015-04-07 DIAGNOSIS — R1013 Epigastric pain: Secondary | ICD-10-CM

## 2015-04-09 ENCOUNTER — Ambulatory Visit
Admission: RE | Admit: 2015-04-09 | Discharge: 2015-04-09 | Disposition: A | Discharge status: Home / Self Care | Payer: BC Managed Care – PPO | Source: Ambulatory Visit | Attending: Surgery | Admitting: Surgery

## 2015-04-09 MED ORDER — IOPAMIDOL (ISOVUE-300) INJECTION 61%
100.0000 mL | Freq: Once | INTRAVENOUS | Status: AC | PRN
  Administered 2015-04-09: 100 mL via INTRAVENOUS

## 2015-07-02 ENCOUNTER — Other Ambulatory Visit: Payer: Self-pay

## 2015-07-02 DIAGNOSIS — Z1231 Encounter for screening mammogram for malignant neoplasm of breast: Secondary | ICD-10-CM

## 2015-07-09 ENCOUNTER — Ambulatory Visit: Payer: BC Managed Care – PPO

## 2015-07-11 ENCOUNTER — Ambulatory Visit: Payer: BC Managed Care – PPO

## 2015-09-29 ENCOUNTER — Ambulatory Visit
Admission: RE | Admit: 2015-09-29 | Discharge: 2015-09-29 | Disposition: A | Discharge status: Home / Self Care | Payer: BLUE CROSS/BLUE SHIELD | Source: Ambulatory Visit

## 2015-09-29 DIAGNOSIS — Z1231 Encounter for screening mammogram for malignant neoplasm of breast: Secondary | ICD-10-CM

## 2016-08-25 ENCOUNTER — Other Ambulatory Visit: Payer: Self-pay | Admitting: Family Medicine

## 2016-08-25 DIAGNOSIS — Z1231 Encounter for screening mammogram for malignant neoplasm of breast: Secondary | ICD-10-CM

## 2016-09-22 IMAGING — CT CT ABD-PELV W/ CM
3 of 5 series · 13 of 36 positions shown, 19 images · IV contrast (READICAT/WATER & [ID] ISOVUE 300)
Comparison: 03/13/2009

CLINICAL DATA: Anemia, fibroids, epigastric pain x weeks; previous
hysterectomy, appendectomy, left oophorectomy

EXAM:
CT ABDOMEN AND PELVIS WITH CONTRAST
TECHNIQUE: Multidetector CT imaging of the abdomen and pelvis was performed
using the standard protocol following bolus administration of
intravenous contrast.
CONTRAST:  100 mL Ksovue-PTT IV

[Series 3: abd/pelvis with · axial · 0.72mm/px · z∈[-334,-8]mm · 7 of 87 slices shown, 12 images]
[im 11/87  soft-tissue]
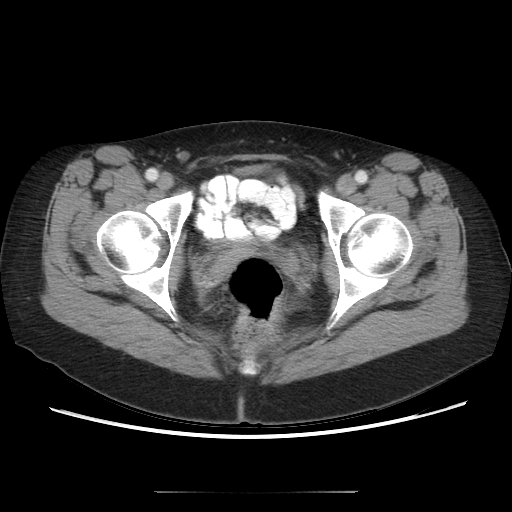
[im 11/87  bone]
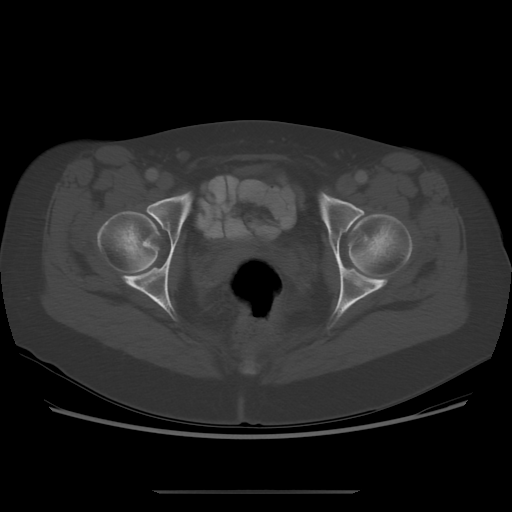
[im 22/87  soft-tissue]
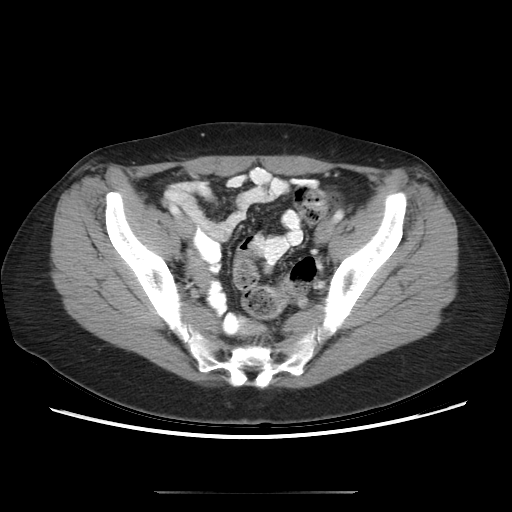
[im 33/87  soft-tissue]
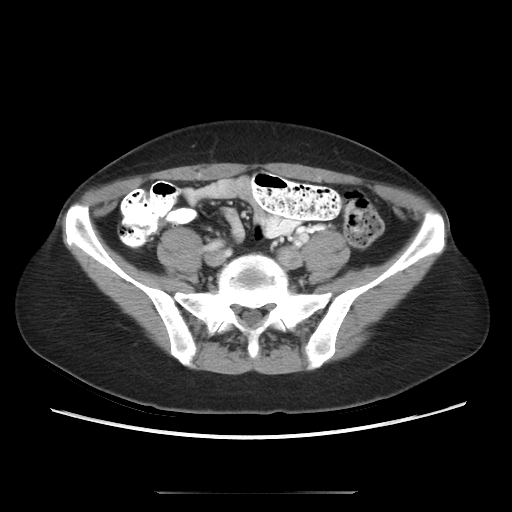
[im 44/87  soft-tissue]
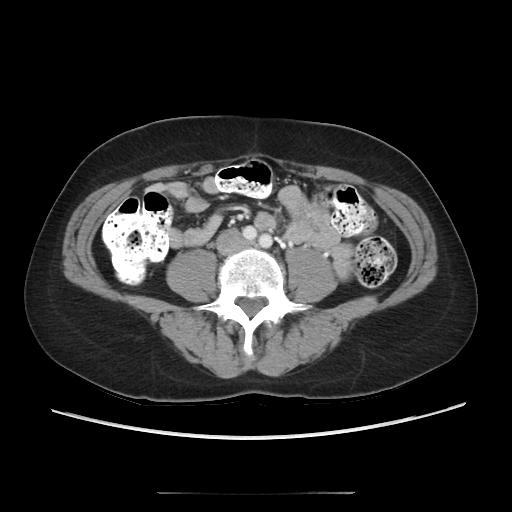
[im 44/87  lung]
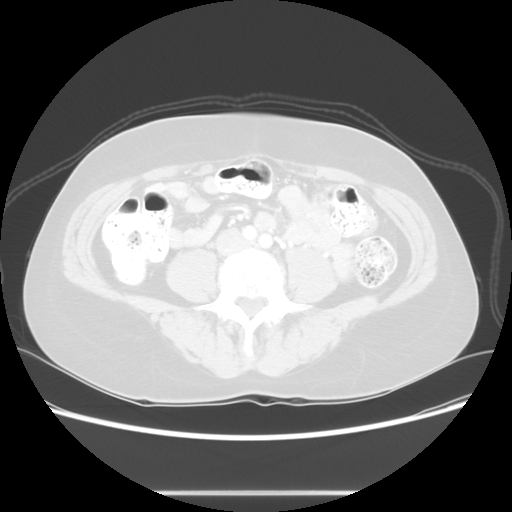
[im 54/87  soft-tissue]
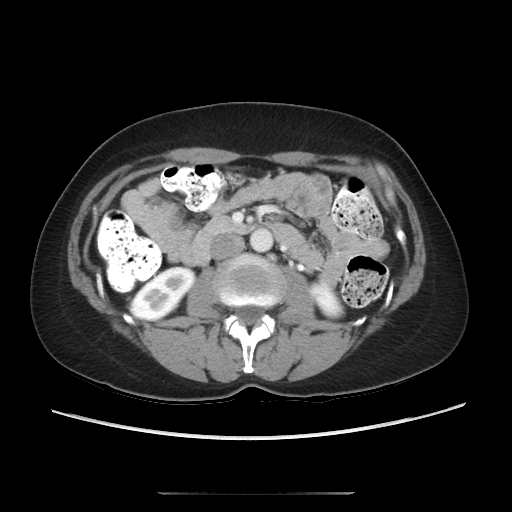
[im 54/87  lung]
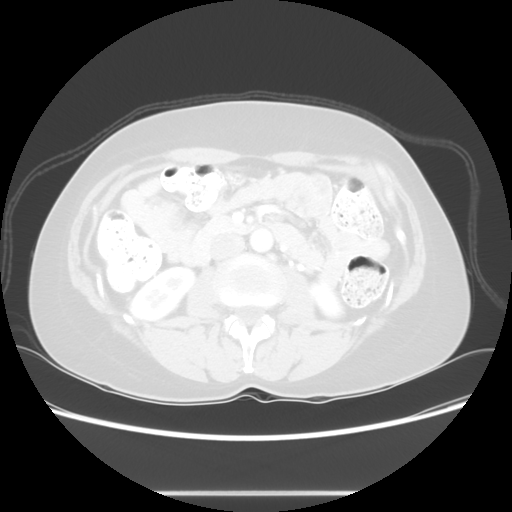
[im 65/87  soft-tissue]
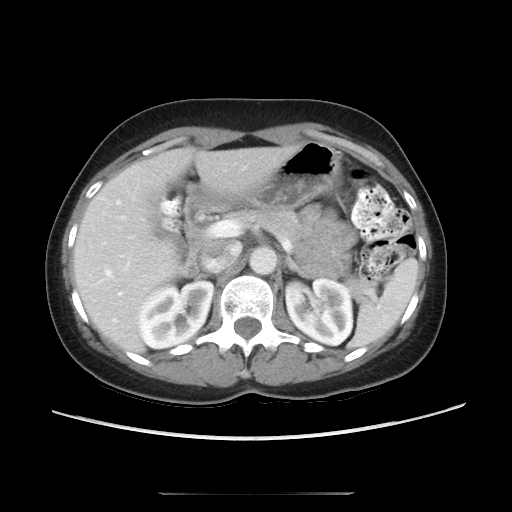
[im 65/87  lung]
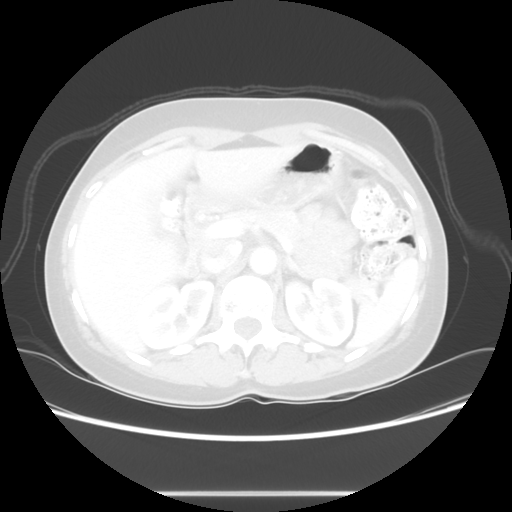
[im 76/87  soft-tissue]
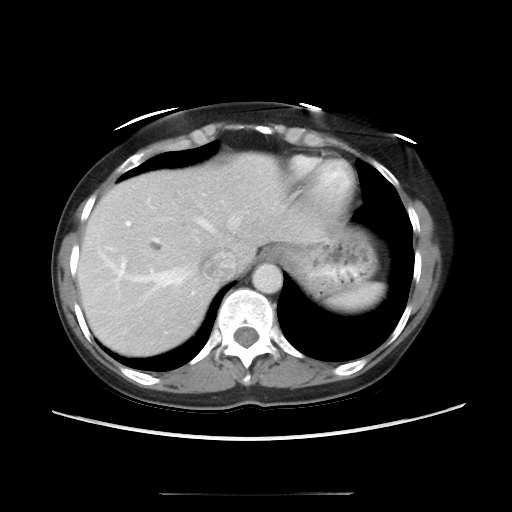
[im 76/87  lung]
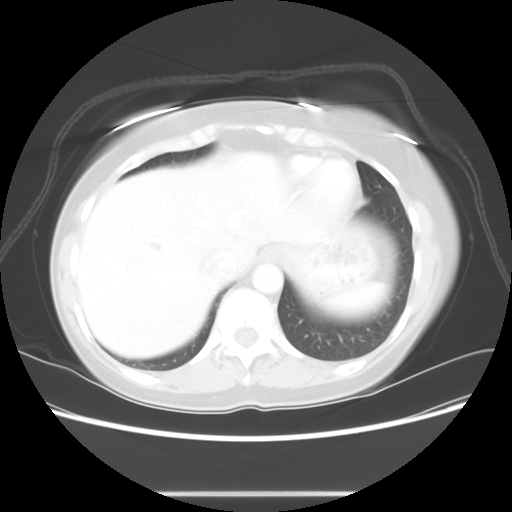

[Series 601: coronal body · coronal · 0.93mm/px · 1 of 108 slices shown, 2 images]
[im 36/108  soft-tissue]
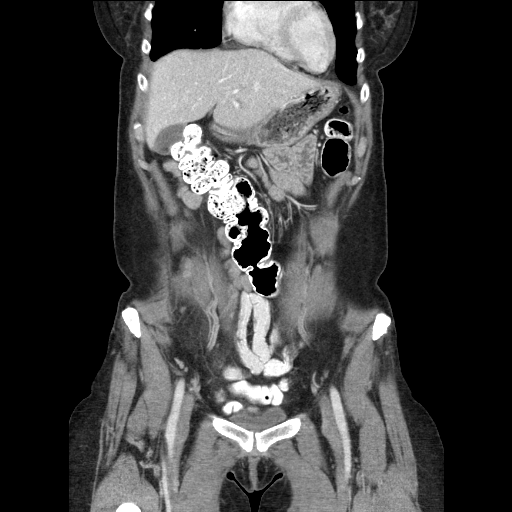
[im 36/108  bone]
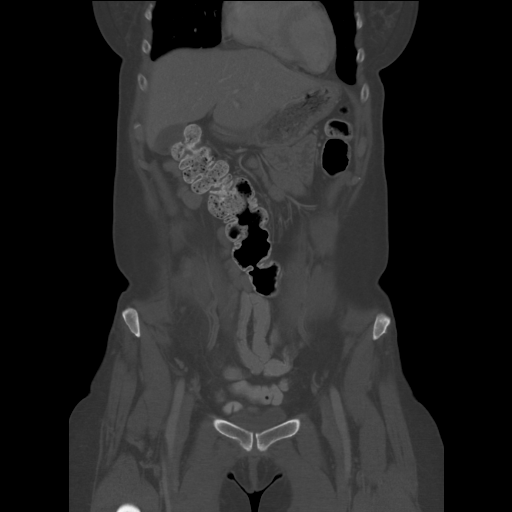

[Series 602: sagittal body · sagittal · 0.93mm/px · 5 of 148 slices shown]
[im 10/148  soft-tissue]
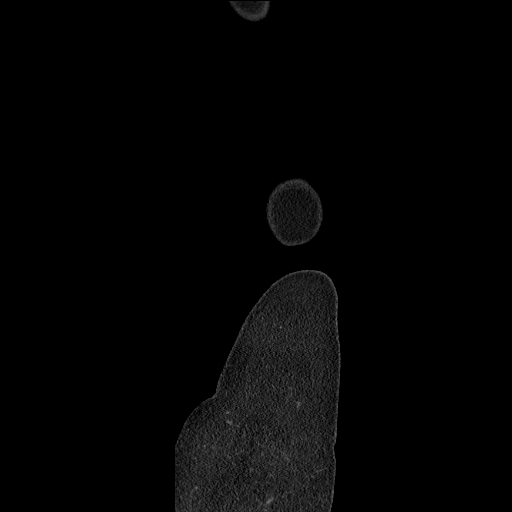
[im 28/148  soft-tissue]
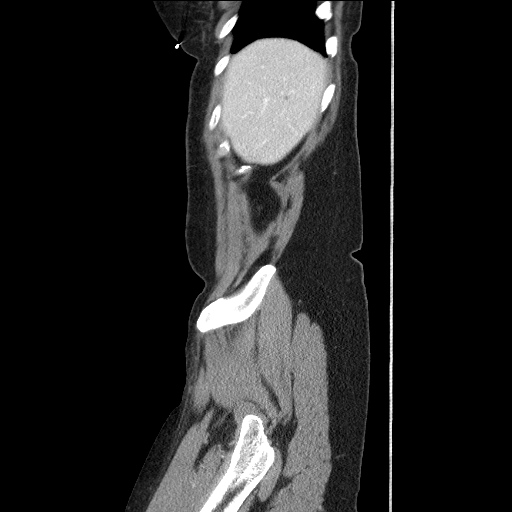
[im 46/148  soft-tissue]
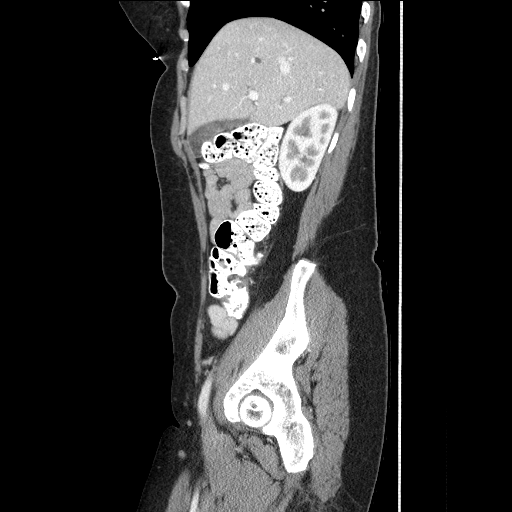
[im 65/148  soft-tissue]
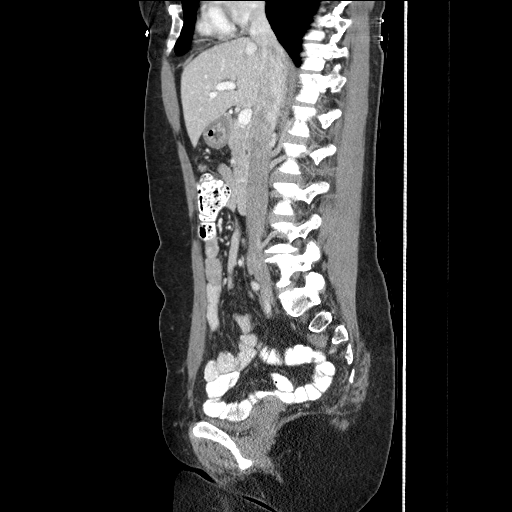
[im 83/148  soft-tissue]
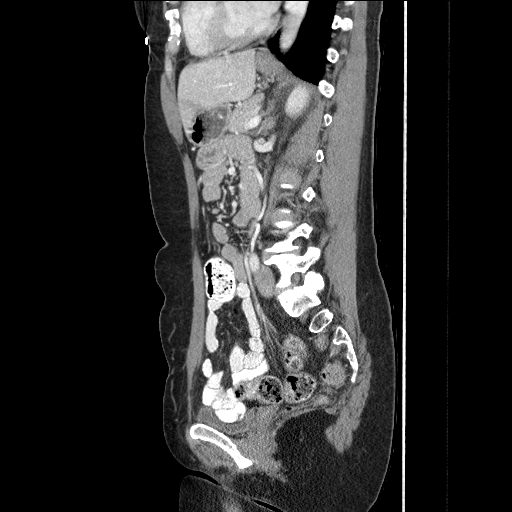

[13 of 36 positions shown; findings below may reference images not displayed]

FINDINGS: Visualized lung bases clear. Stable small cysts in hepatic segments
5 and 8. Gallbladder is incompletely distended. Unremarkable spleen,
pancreas, adrenal glands, kidneys, portal vein, aorta. No
hydronephrosis. Stomach, small bowel, and colon are nondilated.
Appendix surgically absent. Uterus surgically absent. Urinary
bladder incompletely distended. No ascites. No free air. No
adenopathy localized. Advanced degenerative disc disease L4-5 as
before.
IMPRESSION: 1. No acute abdominal process.
2. Stable postop changes.
3. Degenerative disc disease L4-5 as before.

## 2016-09-29 ENCOUNTER — Ambulatory Visit
Admission: RE | Admit: 2016-09-29 | Discharge: 2016-09-29 | Disposition: A | Discharge status: Home / Self Care | Payer: BLUE CROSS/BLUE SHIELD | Source: Ambulatory Visit | Attending: Family Medicine | Admitting: Family Medicine

## 2016-09-29 DIAGNOSIS — Z1231 Encounter for screening mammogram for malignant neoplasm of breast: Secondary | ICD-10-CM

## 2016-10-01 ENCOUNTER — Ambulatory Visit: Payer: BLUE CROSS/BLUE SHIELD
